# Patient Record
Sex: Male | Born: 1955 | Race: Black or African American | Hispanic: No | Marital: Single | Smoking: Never smoker
Health system: Southern US, Community
[De-identification: ages and names within clinical notes are randomized; demographics above are authoritative.]

---

## 2006-11-26 ENCOUNTER — Emergency Department (HOSPITAL_COMMUNITY): Admission: EM | Admit: 2006-11-26 | Discharge: 2006-11-26 | Payer: Self-pay | Admitting: Emergency Medicine

## 2007-11-29 ENCOUNTER — Inpatient Hospital Stay (HOSPITAL_COMMUNITY): Admission: EM | Admit: 2007-11-29 | Discharge: 2007-12-02 | Payer: Self-pay | Admitting: Family Medicine

## 2009-12-19 IMAGING — CR DG ABDOMEN ACUTE W/ 1V CHEST
4 series · 4 of 4 positions shown · non-contrast
Comparison: None

CLINICAL DATA: Dehydration.  Diarrhea.

ACUTE ABDOMEN SERIES (ABDOMEN 2 VIEW & CHEST 1 VIEW)

[w chest pa]
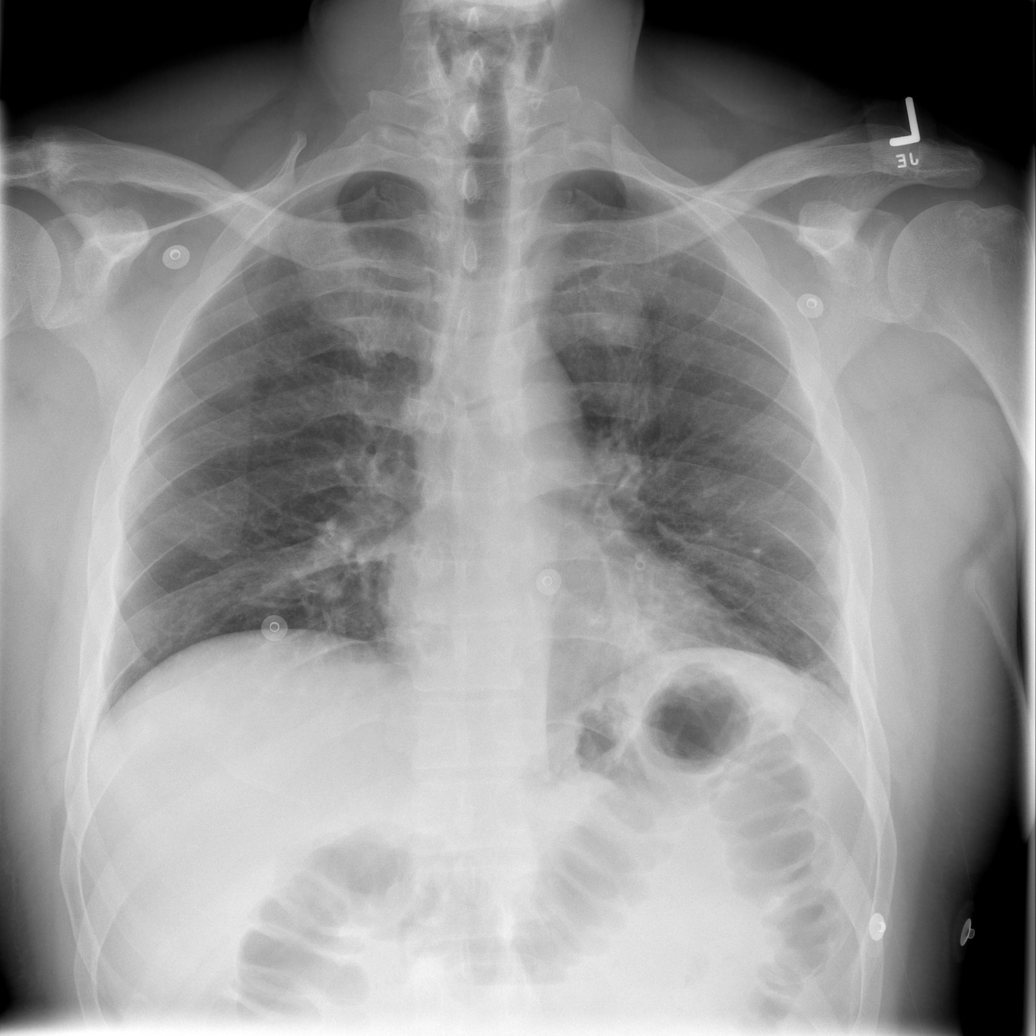

[w abdomen upright]
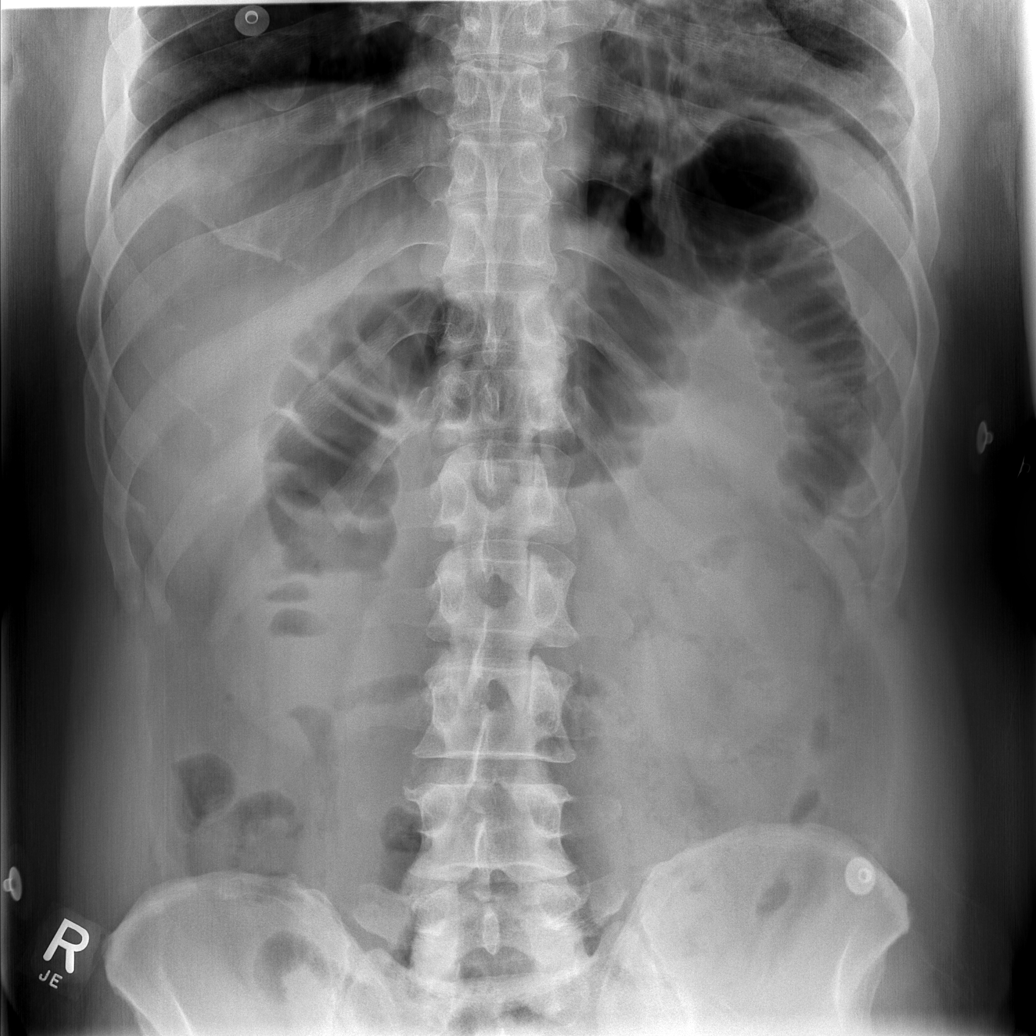

[t abdomen supine (1 of 2)]
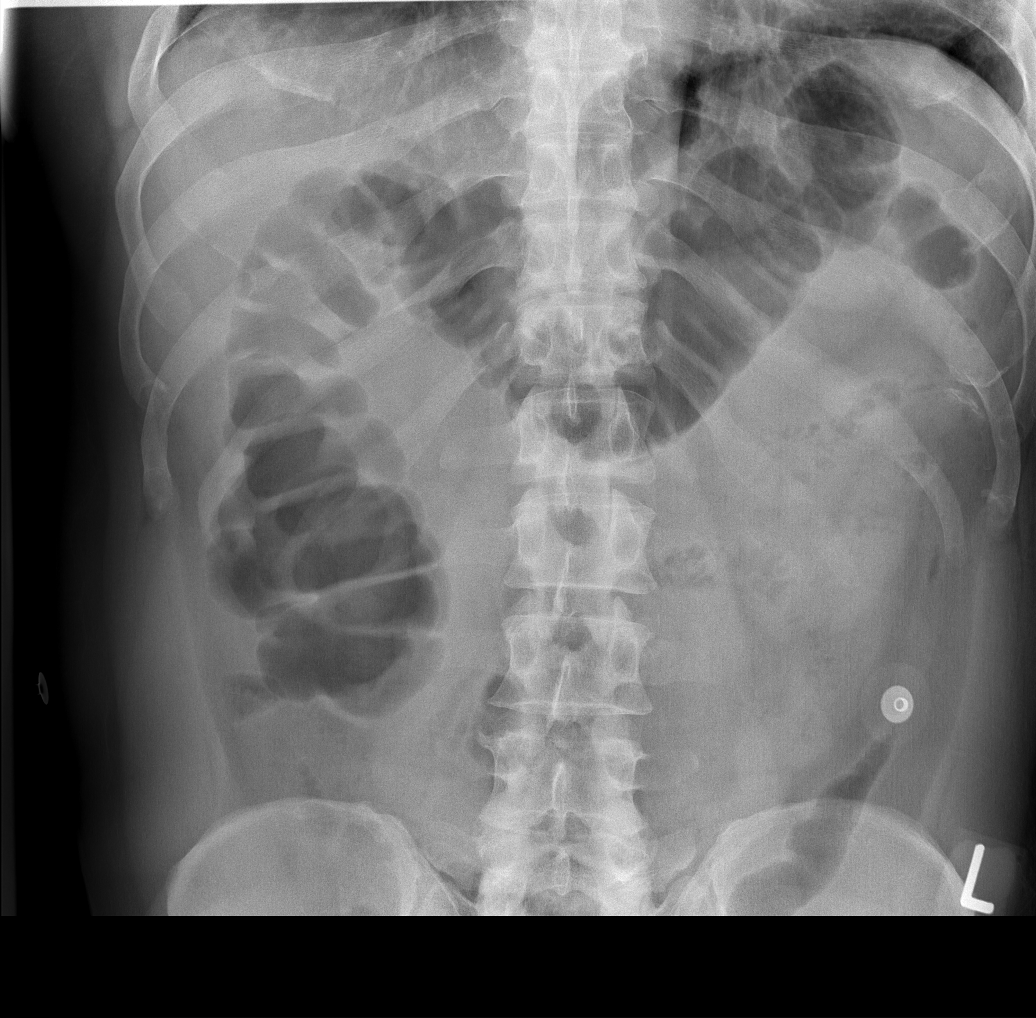

[t abdomen supine (2 of 2)]
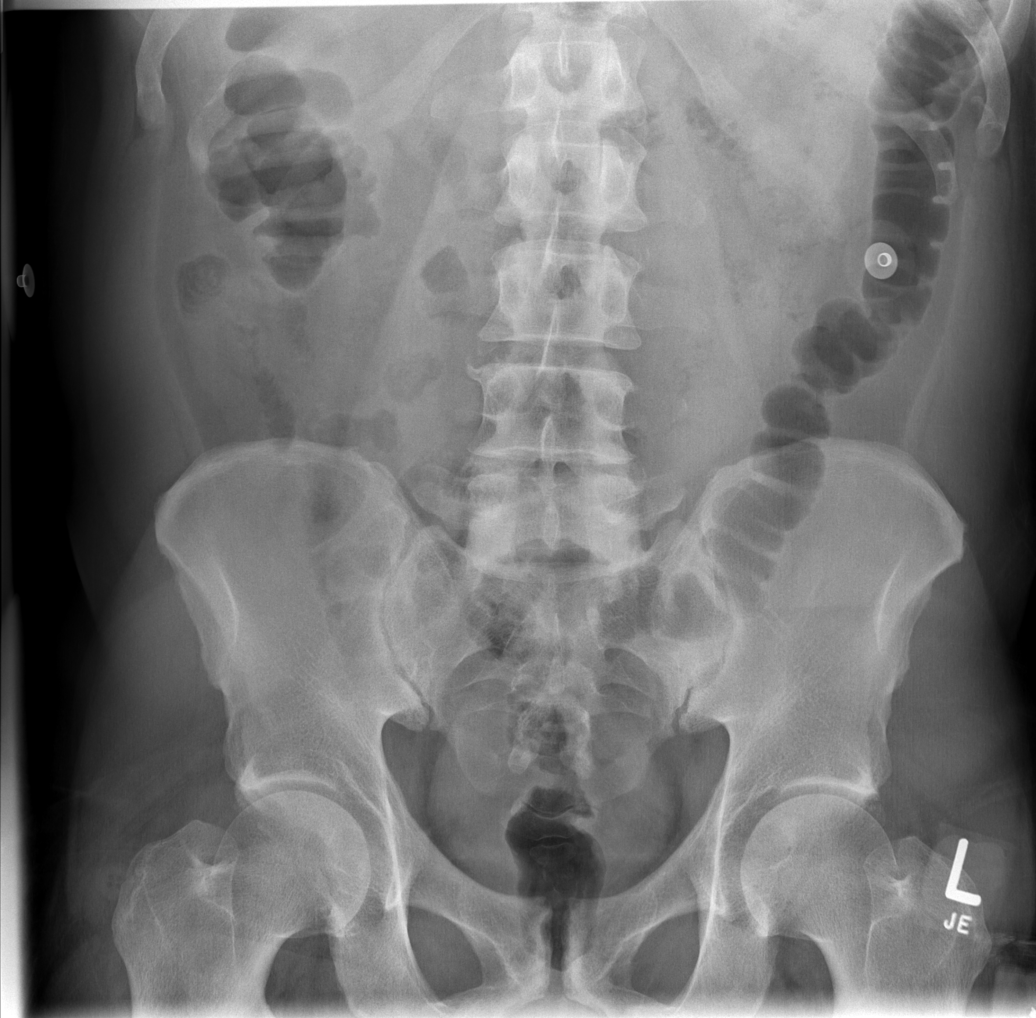

[4 of 4 positions shown; findings below may reference images not displayed]

FINDINGS: Heart size is normal.  There is density at the left lung
base, possibly representing atelectasis, or infiltrate.  A mass in
the left lower lobe is not excluded.  Follow-up is suggested.
There are perihilar bronchitic changes.  There is no free
intraperitoneal air beneath the diaphragm.

Supine and erect views of the abdomen show a nonobstructive bowel
gas pattern.  Gas is seen within nondilated loops of large and
small bowel.
IMPRESSION: 1.  Left lower lobe density consistent atelectasis, or infiltrate.
Mass cannot be entirely excluded.  A lateral view of the chest may
be helpful at this time.
2.  Nonobstructive bowel gas pattern.

## 2009-12-22 IMAGING — CR DG CHEST 2V
2 series · 2 of 2 positions shown · non-contrast
Comparison: Chest x-ray of 11/29/2007

CLINICAL DATA: Cough, weakness, short of breath

CHEST - 2 VIEW

[w chest pa]
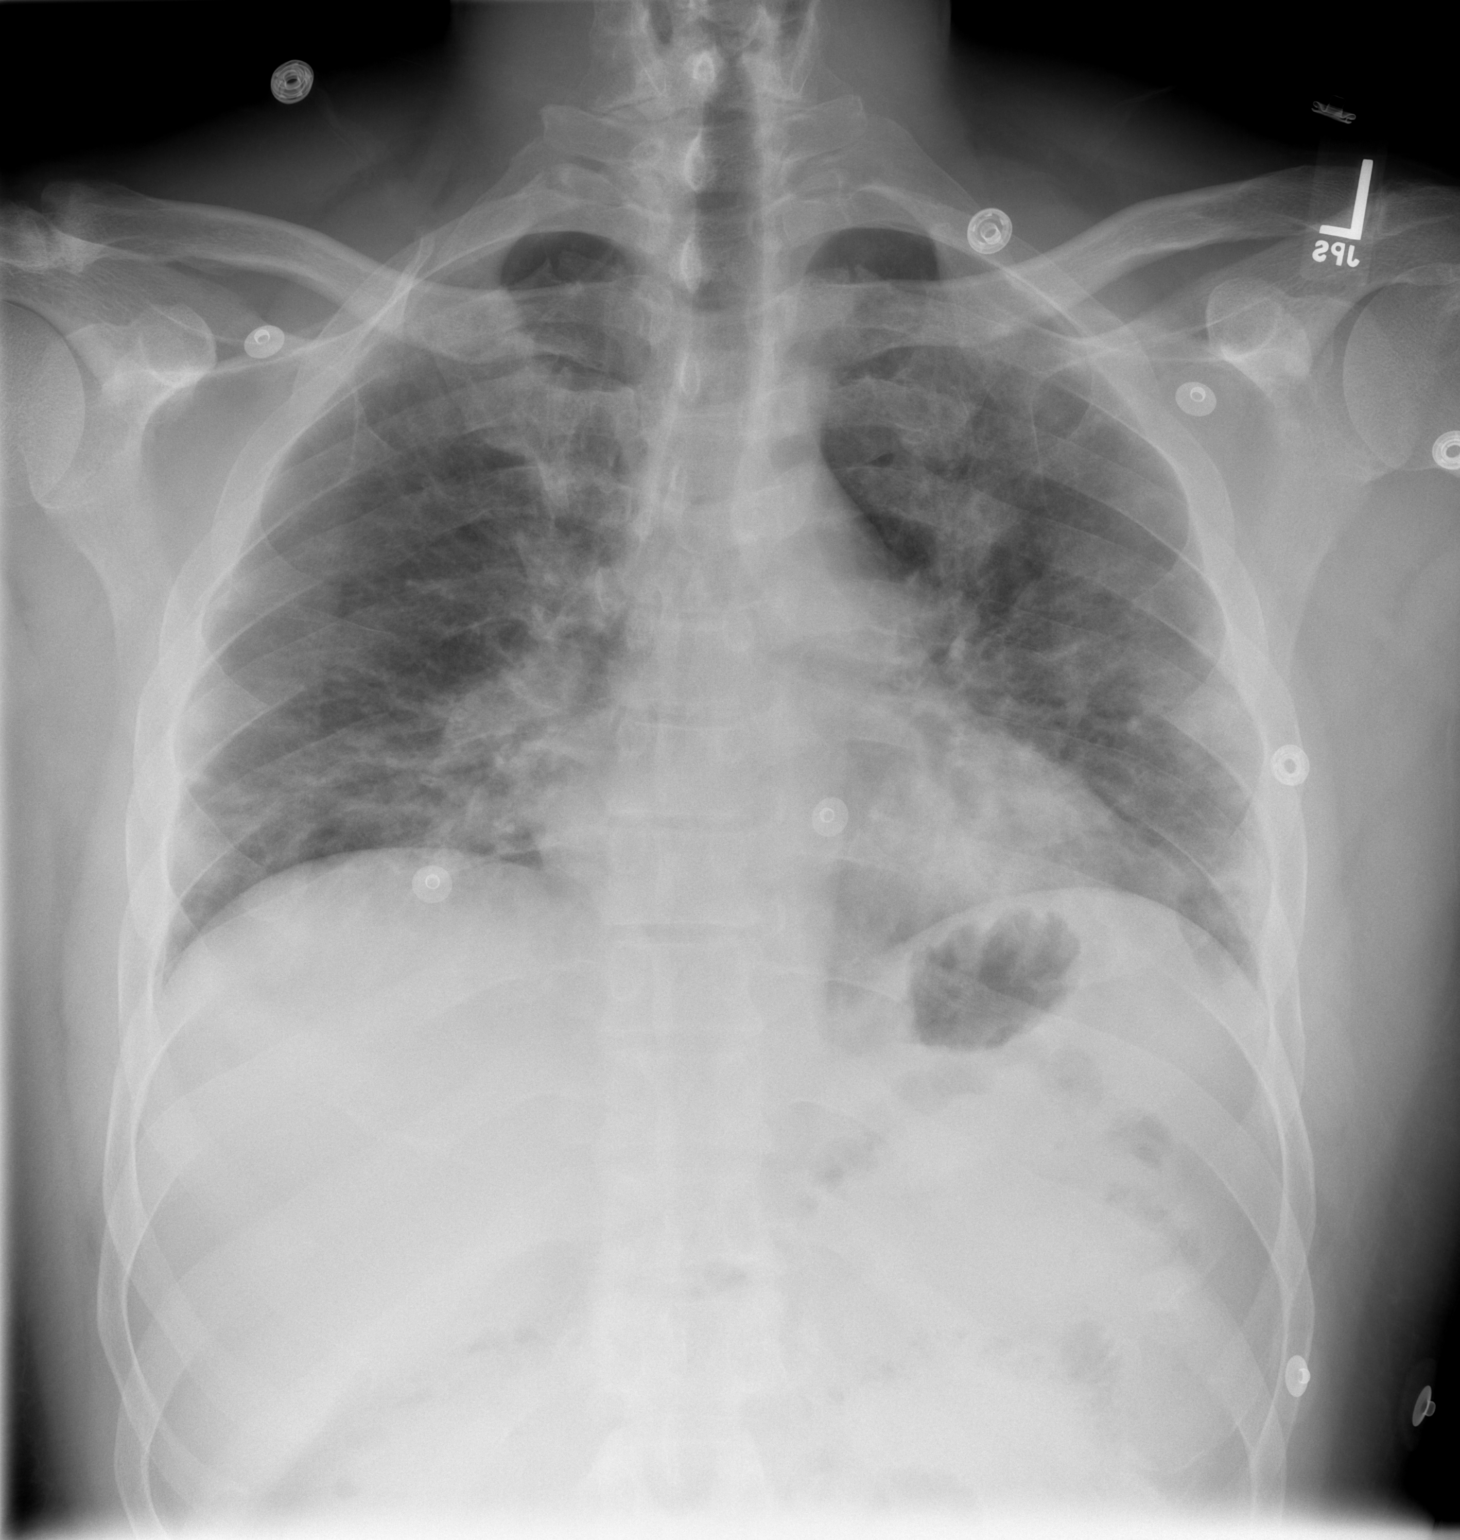

[w chest lat]
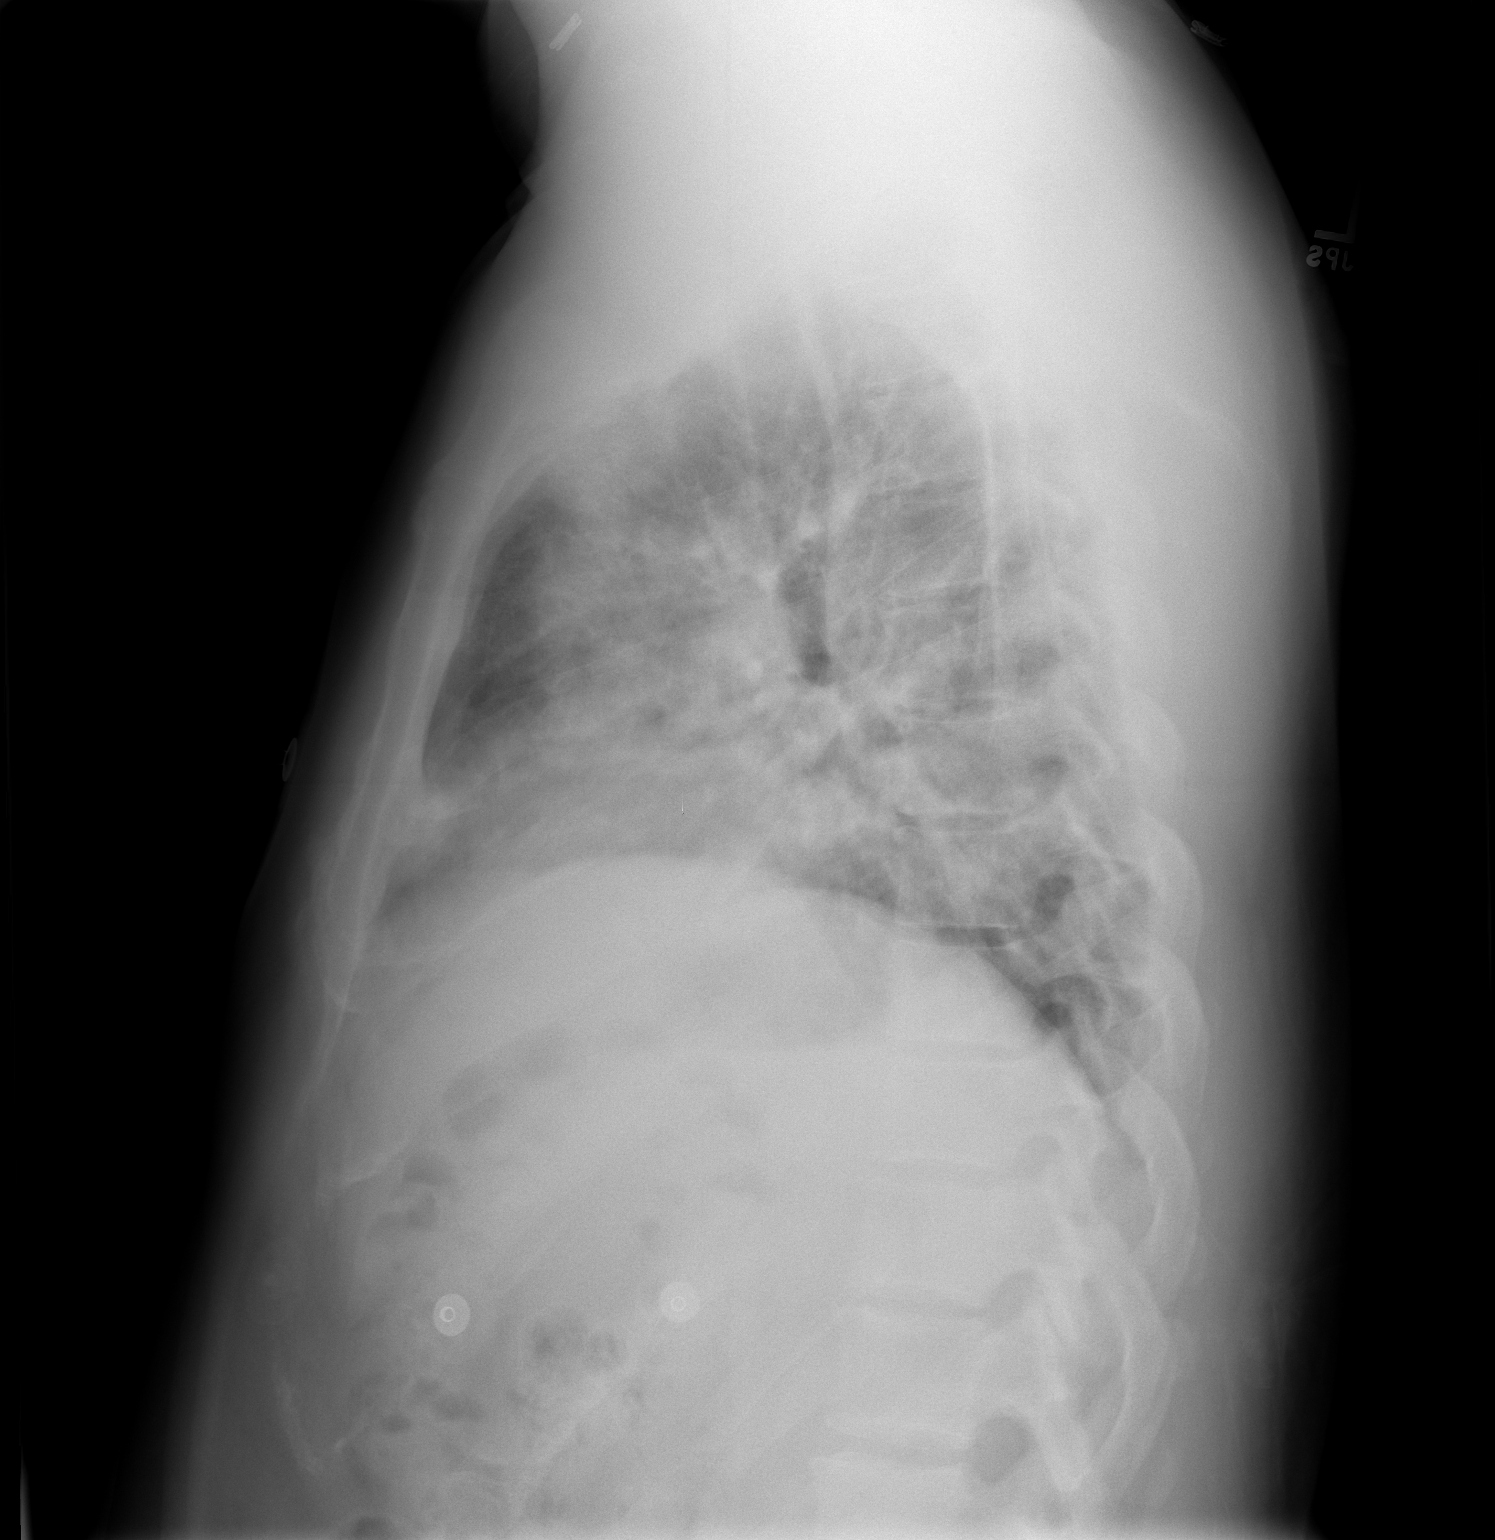

[2 of 2 positions shown; findings below may reference images not displayed]

FINDINGS: There are patchy opacities primarily in the lung bases
and in the left upper lung field suspicious for developing
pneumonia.  No effusion is seen.  The heart is stable in size.
IMPRESSION: Increasing patchy opacities primarily at the bases and in the left
upper lung field worrisome for developing pneumonia.

## 2009-12-27 ENCOUNTER — Emergency Department (HOSPITAL_COMMUNITY)
Admission: EM | Admit: 2009-12-27 | Discharge: 2009-12-27 | Payer: Self-pay | Source: Home / Self Care | Admitting: Family Medicine

## 2009-12-31 ENCOUNTER — Emergency Department (HOSPITAL_COMMUNITY)
Admission: EM | Admit: 2009-12-31 | Discharge: 2009-12-31 | Payer: Self-pay | Source: Home / Self Care | Admitting: Emergency Medicine

## 2010-06-04 NOTE — H&P (Signed)
NAMEPARTICK, MUSSELMAN NO.:  [id number]   MEDICAL RECORD NO.:  [id number]          PATIENT TYPE:  EMS   LOCATION:  URG                          FACILITY:  [facility]   PHYSICIAN:  Eduard Clos, MDDATE OF BIRTH:  [birth year]   DATE OF ADMISSION:  11/29/2007  DATE OF DISCHARGE:                              HISTORY & PHYSICAL   PRIMARY CARE PHYSICIAN:  Unassigned.   CHIEF COMPLAINT:  Diarrhea.   HISTORY OF PRESENT ILLNESS:  A 55 year old male with no significant past  medical history presented to the ER complaining of ongoing diarrhea for  the last 1-week.  The patient stated the diarrhea started a week ago,  which is continuous, not related to abdominal pain.  No fevers, chills  or any nausea or vomiting.  The patient denies going to any foreign  countries or having any food which is not usual and has had no contacts  with anybody having diarrhea.  The patient in the ER was found to have a  creatinine of 3.2 with a BUN of 103, stool guaiac being positive.  He is  being admitted for further management of his severe diarrhea,  dehydration and renal failure.   PAST MEDICAL HISTORY:  Nothing significant.   PAST SURGICAL HISTORY:  Left knee surgery for trauma as a child.   MEDICATIONS PRIOR TO ADMISSION:  None.   FAMILY HISTORY:  Nothing contributory.   SOCIAL HISTORY:  He denies smoking cigarettes, drinking alcohol or using  illegal drugs.  He lives alone.   REVIEW OF SYSTEMS:  As per history of present illness, nothing else  significant.  Specifically, the patient denies any chest pain, shortness  of breath, loss of consciousness, dizziness, dysuria, weakness of limbs,  headache, cough or productive sputum.   PHYSICAL EXAMINATION:  GENERAL:  Patient examined at bedside, not in  acute distress.  VITAL SIGNS:  Blood pressure 140/80, pulse 90 per minute, temperature  100.3, respirations 18 per minute.  O2 sats 98%.  HEENT:  Anicteric, no pallor.  CHEST:   Bilateral air entry present.  No rhonchi, no crepitation.  HEART:  S1-S2 heard.  ABDOMEN:  Soft, nontender.  Bowel sounds heard.  No guarding, no  rigidity.  CNS:  Alert, awake, oriented to time, place and person.  Moves upper and  lower extremities 5/5.  EXTREMITIES:  Peripheral pulses felt.  No edema.   LABORATORY DATA:  CBC:  WBC 4.3, hemoglobin 20.7, hematocrit 51,  platelets 203, neutrophils 57%.  Basic metabolic panel:  Sodium 126,  potassium 5, chloride 101, glucose 144, BUN 103, creatinine 3.2.  Stool  for occult blood is negative.   ASSESSMENT:  1. Severe diarrhea.  2. Severe dehydration secondary to diarrhea.  3. Acute renal failure from dehydration.  4. Hyponatremia from dehydration.   PLAN:  Will admit patient to medical floor for aggressive IV hydration.  Since the patient has a mild fever, will start on empiric antibiotics.  Sent stool for ova and parasite cultures, lactoferrin and WBC.  Place  patient on Questran.  Further recommendations and condition evolves.      Georgetta Haber  Demetra Shiner, MD  Electronically Signed     ANK/MEDQ  D:  11/29/2007  T:  11/29/2007  Job:  [id number]

## 2010-06-04 NOTE — Discharge Summary (Signed)
[name], [name] NO.:  [id number]   MEDICAL RECORD NO.:  [id number]          PATIENT TYPE:  INP   LOCATION:  5528                         FACILITY:  [facility]   PHYSICIAN:  Julian Gomez, M.D.    DATE OF BIRTH:  1955-04-26   DATE OF ADMISSION:  11/29/2007  DATE OF DISCHARGE:  12/02/2007                               DISCHARGE SUMMARY   DISCHARGE DIAGNOSES:  1. Diarrheal illness.  2. Acute renal failure secondary to hypovolemia.  3. Hyponatremia secondary to hypovolemia.  4. Microhematuria and proteinuria.  5. Bibasilar infiltrates, treating clinically as pneumonia.   DISCHARGE MEDICATIONS:  1. Avelox 400 mg daily until completed.  2. Flagyl 500 mg t.i.d. until completed.  3. Multivitamin once daily.   DISCHARGE DISPOSITION:  The patient is being discharged to home in  improved and stable condition.  A followup appointment is being arranged  with his new primary care physician, Dr. Della Gomez.   CONSULTATIONS:  Curbside consultation with nephrologist, Dr. Annie Gomez.   PROCEDURES PERFORMED:  1. Chest x-ray on December 02, 2007.  The results revealed increasing      patchy opacities primarily at the bases and in the left upper lung      field worrisome for developing pneumonia.  2. Renal ultrasound on November 30, 2007.  The results revealed no      evidence for hydronephrosis.  Heterogenous increased renal cortical      echogenicity bilaterally.  This may be secondary to medical renal      disease.  Pyelonephritis would also be a consideration with areas      of segmental edema.  3. Acute abdominal series on November 29, 2007.  The results revealed      left lower lobe density consistent with atelectasis or infiltrate.      Nonobstructive bowel gas pattern.   HISTORY OF PRESENT ILLNESS:  The patient is a 55 year old man with no  significant past medical history, who presented to the emergency  department on November 29, 2007 with a chief  complaint of ongoing  diarrhea for 1 week.  When the patient was evaluated in the emergency  department, he was noted to be hemodynamically stable and febrile with a  temperature of 100.3.  His lab data were significant for a hemoglobin of  20.7, a BUN of 103, and a creatinine of 3.2.  The patient was admitted  for further evaluation and management.   For additional details, please see the dictated history and physical.   HOSPITAL COURSE:  1. SEVERE DIARRHEA, ACUTE RENAL FAILURE, HYPONATREMIA, HYPOVOLEMIA.      The patient was started on IV fluid volume repletion with normal      saline at 150 mL an hour following a 1 L bolus.  Stool studies were      ordered, as well as blood cultures.  The patient was started on      empiric treatment with intravenous Flagyl and Cipro.  Questran was      added as well.  His renal function was followed closely.  An acute  abdominal series was ordered at the time of the initial hospital      assessment and it was unremarkable with the exception of a possible      infiltrate in the bases.  Over the course of the hospitalization,      the patient's stool studies revealed no evidence of C. diff, heme      positivity, and a pending culture.  Over the past 24 hours, he has      had no further loose bowel movements.  His urine output during the      hospitalization was within normal limits.  As of today, his BUN has      improved to 16 and his creatinine has improved to 1.29.  His serum      sodium which was 126 at the time of the initial hospital      assessment, normalized to 137 prior to hospital discharge.  Upon      discharge, the patient will be continued on empiric treatment with      Flagyl 500 mg t.i.d. for 10 more days.  The Cipro was discontinued      after 3-1/2 days of therapy when it was felt that the patient had      concomitant pneumonia.  Avelox was started instead.  2. MICROHEMATURIA AND PROTEINURIA.  The patient's initial urinalysis       revealed large blood, greater than 300 protein, uric acid crystals,      0-2 RBCs, and no leukocytes. Both the urine and blood cultures were      negative.  The patient had no complaints of painful urination      during the hospitalization.  However, given the results of the      urinalysis, a 24-hour urine to assess the proteinuria was ordered.      The 24-hour urine yielded 3067 mg of protein.  Nephrologist, Dr.      Kathrene Gomez was consulted (curb-side).  Per her assessment, in the      setting of acute renal injury from dehydration the amount of      proteinuria can be difficult to interpret.  She recommended      repeating the 24-hour urine in a few weeks for reassessment.  A      renal ultrasound was also ordered for further evaluation and it      revealed no evidence of hydronephrosis; however, per the      radiologist's interpretation, there was evidence of heterogeneous      increased renal cortical echogenicity bilaterally and a      consideration for pyelonephritis.  3. PNEUMONIA.  At the time of the initial hospital assessment, the      patient's chest x-ray revealed a left lower lobe density consistent      with atelectasis or infiltrate.  Because he was febrile, he was      started on empiric treatment with Flagyl and Cipro primarily for      presumed infectious diarrhea.  A followup chest x-ray following      volume repletion revealed bibasilar opacities.  The patient did      have a cough which was primarily nonproductive.  He attributed the      cough to a cold.  Following the results of the second chest x-      ray, Cipro was discontinued and he was subsequently started on      treatment with Avelox prior to hospital discharge.  The patient is  currently afebrile and his white blood cell count has normalized to      4.8.  Upon discharge, he will be continued on treatment with Avelox      for an additional week.      Julian Gomez, M.D.  Electronically  Signed     DF/MEDQ  D:  12/02/2007  T:  12/03/2007  Job:  [id number]   cc:   Julian Gomez, M.D.

## 2010-10-22 LAB — OSMOLALITY, URINE: Osmolality, Ur: 682

## 2010-10-22 LAB — URINE CULTURE
Colony Count: NO GROWTH
Culture: NO GROWTH

## 2010-10-22 LAB — COMPREHENSIVE METABOLIC PANEL
ALT: 31
AST: 65 — ABNORMAL HIGH
Albumin: 2.4 — ABNORMAL LOW
Albumin: 2.7 — ABNORMAL LOW
Alkaline Phosphatase: 50
BUN: 32 — ABNORMAL HIGH
BUN: 69 — ABNORMAL HIGH
Calcium: 7.5 — ABNORMAL LOW
Chloride: 107
Creatinine, Ser: 1.5
Creatinine, Ser: 2.06 — ABNORMAL HIGH
Total Bilirubin: 0.6
Total Bilirubin: 0.6

## 2010-10-22 LAB — LIPID PANEL
Cholesterol: 116
LDL Cholesterol: 54
Total CHOL/HDL Ratio: 6.8
Triglycerides: 224 — ABNORMAL HIGH

## 2010-10-22 LAB — URINALYSIS, ROUTINE W REFLEX MICROSCOPIC
Protein, ur: >300 — AB
Specific Gravity, Urine: 1.022
Specific Gravity, Urine: 1.023
Urobilinogen, UA: 0.2

## 2010-10-22 LAB — CBC
HCT: 56 — ABNORMAL HIGH
Hemoglobin: 16.4
MCHC: 32.4
MCHC: 32.5
MCV: 84
Platelets: 182
RBC: 5.11
RBC: 5.76
RDW: 13.8
RDW: 14.1
WBC: 4.3

## 2010-10-22 LAB — HEPATIC FUNCTION PANEL
ALT: 34
Albumin: 2.8 — ABNORMAL LOW
Alkaline Phosphatase: 57
Bilirubin, Direct: <0.1
Total Bilirubin: 0.4
Total Protein: 6.6

## 2010-10-22 LAB — CREATININE CLEARANCE, URINE, 24 HOUR
Collection Interval-CRCL: 24
Creatinine: 1.5
Urine Total Volume-CRCL: 2175

## 2010-10-22 LAB — DIFFERENTIAL
Basophils Absolute: 0
Basophils Relative: 0
Monocytes Relative: 11
Neutro Abs: 2.4
Neutrophils Relative %: 57

## 2010-10-22 LAB — GLUCOSE, CAPILLARY: Glucose-Capillary: 105 — ABNORMAL HIGH

## 2010-10-22 LAB — BASIC METABOLIC PANEL
BUN: 16
CO2: 20
Calcium: 8 — ABNORMAL LOW
Creatinine, Ser: 1.29
GFR calc Af Amer: >60

## 2010-10-22 LAB — URINE MICROSCOPIC-ADD ON

## 2010-10-22 LAB — CLOSTRIDIUM DIFFICILE EIA: C difficile Toxins A+B, EIA: NEGATIVE

## 2010-10-22 LAB — TSH: TSH: 1.395

## 2010-10-22 LAB — GIARDIA/CRYPTOSPORIDIUM SCREEN(EIA): Giardia Screen - EIA: NEGATIVE

## 2010-10-22 LAB — HEMOGLOBIN A1C: Hgb A1c MFr Bld: 6.4 — ABNORMAL HIGH

## 2010-10-22 LAB — POCT I-STAT, CHEM 8
Chloride: 101
Creatinine, Ser: 3.2 — ABNORMAL HIGH
Glucose, Bld: 144 — ABNORMAL HIGH
Sodium: 126 — ABNORMAL LOW
TCO2: 22

## 2010-10-22 LAB — PROTEIN, URINE, 24 HOUR
Collection Interval-UPROT: 24
Protein, 24H Urine: 3067 — ABNORMAL HIGH
Urine Total Volume-UPROT: 2175

## 2010-10-22 LAB — SODIUM, URINE, RANDOM: Sodium, Ur: 29

## 2010-10-22 LAB — STOOL CULTURE

## 2019-01-25 ENCOUNTER — Encounter (HOSPITAL_COMMUNITY): Payer: Self-pay

## 2019-01-25 ENCOUNTER — Other Ambulatory Visit: Payer: Self-pay

## 2019-01-25 ENCOUNTER — Ambulatory Visit (HOSPITAL_COMMUNITY)
Admission: EM | Admit: 2019-01-25 | Discharge: 2019-01-25 | Disposition: A | Discharge status: Home / Self Care | Payer: BC Managed Care – PPO | Attending: Family Medicine | Admitting: Family Medicine

## 2019-01-25 DIAGNOSIS — R300 Dysuria: Secondary | ICD-10-CM | POA: Insufficient documentation

## 2019-01-25 LAB — POCT URINALYSIS DIP (DEVICE)
Bilirubin Urine: NEGATIVE
Glucose, UA: NEGATIVE mg/dL
Ketones, ur: NEGATIVE mg/dL
Leukocytes,Ua: NEGATIVE
Nitrite: NEGATIVE
Protein, ur: NEGATIVE mg/dL
Specific Gravity, Urine: >=1.03 (ref 1.005–1.030)
Urobilinogen, UA: 0.2 mg/dL (ref 0.0–1.0)
pH: 5.5 (ref 5.0–8.0)

## 2019-01-25 MED ORDER — LIDOCAINE HCL (PF) 1 % IJ SOLN
INTRAMUSCULAR | Status: AC
  Filled 2019-01-25: qty 2

## 2019-01-25 MED ORDER — AZITHROMYCIN 250 MG PO TABS
1000.0000 mg | ORAL_TABLET | Freq: Once | ORAL | Status: AC
  Administered 2019-01-25: 1000 mg via ORAL

## 2019-01-25 MED ORDER — CEFTRIAXONE SODIUM 1 G IJ SOLR
INTRAMUSCULAR | Status: AC
  Filled 2019-01-25: qty 10

## 2019-01-25 MED ORDER — CEFTRIAXONE SODIUM 1 G IJ SOLR
0.5000 g | Freq: Once | INTRAMUSCULAR | Status: AC
  Administered 2019-01-25: 16:00:00 0.5 g via INTRAMUSCULAR

## 2019-01-25 MED ORDER — AZITHROMYCIN 250 MG PO TABS
ORAL_TABLET | ORAL | Status: AC
  Filled 2019-01-25: qty 4

## 2019-01-25 NOTE — ED Triage Notes (Signed)
Pt. States he has had painful urination since Thursday, it burns when he urinates.

## 2019-01-25 NOTE — Discharge Instructions (Addendum)
Your urine did not show any infection.  I will send for culture.  We will treat you prophylactically for possible STD exposure. We will send the swab for testing and call you with any positive results

## 2019-01-26 LAB — CYTOLOGY, (ORAL, ANAL, URETHRAL) ANCILLARY ONLY
Chlamydia: NEGATIVE
Neisseria Gonorrhea: NEGATIVE
Trichomonas: NEGATIVE

## 2019-01-26 LAB — URINE CULTURE: Culture: NO GROWTH

## 2019-01-26 NOTE — ED Provider Notes (Signed)
[facility]    CSN: [id number] Arrival date & time: 01/25/19  1317      History   Chief Complaint Chief Complaint  Patient presents with  . Urinary Tract Infection    HPI Julian Gomez is a 64 y.o. male.   Patient is a 64 year old male who presents today with dysuria x5 days.  Symptoms have been constant.  Denies any associated urinary frequency, hematuria, testicle pain or swelling.  No penile discharge.  No fever, flank pain, nausea or vomiting.  He is concerned of STDs.   ROS per HPI      History reviewed. No pertinent past medical history.  There are no problems to display for this patient.   History reviewed. No pertinent surgical history.     Home Medications    Prior to Admission medications   Not on File    Family History Family History  Problem Relation Age of Onset  . Healthy Mother   . Diabetes Father     Social History Social History   Tobacco Use  . Smoking status: Never Smoker  . Smokeless tobacco: Never Used  Substance Use Topics  . Alcohol use: Yes    Comment: socially  . Drug use: Never     Allergies   Patient has no known allergies.   Review of Systems Review of Systems   Physical Exam Triage Vital Signs ED Triage Vitals  Enc Vitals Group     BP 01/25/19 1439 (!) 173/96     Pulse Rate 01/25/19 1439 95     Resp 01/25/19 1439 18     Temp 01/25/19 1439 99.3 F (37.4 C)     Temp Source 01/25/19 1439 Oral     SpO2 01/25/19 1439 100 %     Weight 01/25/19 1436 264 lb 6.4 oz (119.9 kg)     Height --      Head Circumference --      Peak Flow --      Pain Score 01/25/19 1436 0     Pain Loc --      Pain Edu? --      Excl. in [place]? --    No data found.  Updated Vital Signs BP (!) 173/96 (BP Location: Right Arm)   Pulse 95   Temp 99.3 F (37.4 C) (Oral)   Resp 18   Wt 264 lb 6.4 oz (119.9 kg)   SpO2 100%   Visual Acuity Right Eye Distance:   Left Eye Distance:   Bilateral Distance:    Right Eye  Near:   Left Eye Near:    Bilateral Near:     Physical Exam Vitals and nursing note reviewed.  Constitutional:      Appearance: Normal appearance.  HENT:     Head: Normocephalic and atraumatic.     Nose: Nose normal.  Eyes:     Conjunctiva/sclera: Conjunctivae normal.  Pulmonary:     Effort: Pulmonary effort is normal.  Abdominal:     Palpations: Abdomen is soft.     Tenderness: There is no abdominal tenderness.  Musculoskeletal:        General: Normal range of motion.     Cervical back: Normal range of motion.  Skin:    General: Skin is warm and dry.  Neurological:     Mental Status: He is alert.  Psychiatric:        Mood and Affect: Mood normal.      UC Treatments / Results  Labs (  all labs ordered are listed, but only abnormal results are displayed) Labs Reviewed  POCT URINALYSIS DIP (DEVICE) - Abnormal; Notable for the following components:      Result Value   Hgb urine dipstick SMALL (*)    All other components within normal limits  URINE CULTURE  CYTOLOGY, (ORAL, ANAL, URETHRAL) ANCILLARY ONLY    EKG   Radiology No results found.  Procedures Procedures (including critical care time)  Medications Ordered in UC Medications  azithromycin (ZITHROMAX) tablet 1,000 mg (1,000 mg Oral Given 01/25/19 1531)  cefTRIAXone (ROCEPHIN) injection 0.5 g (0.5 g Intramuscular Given 01/25/19 1531)    Initial Impression / Assessment and Plan / UC Course  I have reviewed the triage vital signs and the nursing notes.  Pertinent labs & imaging results that were available during my care of the patient were reviewed by me and considered in my medical decision making (see chart for details).     Dysuria-urine without infection here today.  Showed small hemoglobin Will send for culture. Patient concerned for STDs.  Swab sent for testing Patient requesting to be treated for STDs. Treated prophylactically for gonorrhea and chlamydia here today in clinic Labs pending Final  Clinical Impressions(s) / UC Diagnoses   Final diagnoses:  Dysuria     Discharge Instructions     Your urine did not show any infection.  I will send for culture.  We will treat you prophylactically for possible STD exposure. We will send the swab for testing and call you with any positive results    ED Prescriptions    None     PDMP not reviewed this encounter.   Loura Halt A, NP 01/26/19 1457

## 2019-06-27 ENCOUNTER — Ambulatory Visit (INDEPENDENT_AMBULATORY_CARE_PROVIDER_SITE_OTHER): Payer: BC Managed Care – PPO

## 2019-06-27 ENCOUNTER — Encounter (HOSPITAL_COMMUNITY): Payer: Self-pay

## 2019-06-27 ENCOUNTER — Other Ambulatory Visit: Payer: Self-pay

## 2019-06-27 ENCOUNTER — Ambulatory Visit (HOSPITAL_COMMUNITY)
Admission: EM | Admit: 2019-06-27 | Discharge: 2019-06-27 | Disposition: A | Discharge status: Home / Self Care | Payer: BC Managed Care – PPO | Attending: Family Medicine | Admitting: Family Medicine

## 2019-06-27 DIAGNOSIS — S46912A Strain of unspecified muscle, fascia and tendon at shoulder and upper arm level, left arm, initial encounter: Secondary | ICD-10-CM | POA: Diagnosis not present

## 2019-06-27 DIAGNOSIS — M25512 Pain in left shoulder: Secondary | ICD-10-CM

## 2019-06-27 MED ORDER — PREDNISONE 10 MG (21) PO TBPK: ORAL_TABLET | ORAL | 0 refills | Status: AC

## 2019-06-27 NOTE — ED Provider Notes (Signed)
[place]    CSN: [id number] Arrival date & time: 06/27/19  0803      History   Chief Complaint Chief Complaint  Patient presents with  . Shoulder Pain    HPI Julian Gomez is a 64 y.o. male.   Patient is a 64 year old male presents today with left shoulder pain. This started approximate 4 days ago after doing some heavy lifting. Symptoms have been constant. He has been taking Aleve with some relief of the pain. Limited range of motion. Denies any radiation of pain, numbness, tingling, weakness in arm.  ROS per HPI      History reviewed. No pertinent past medical history.  There are no problems to display for this patient.   History reviewed. No pertinent surgical history.     Home Medications    Prior to Admission medications   Medication Sig Start Date End Date Taking? Authorizing Provider  predniSONE (STERAPRED UNI-PAK 21 TAB) 10 MG (21) TBPK tablet 6 tabs for 1 day, then 5 tabs for 1 das, then 4 tabs for 1 day, then 3 tabs for 1 day, 2 tabs for 1 day, then 1 tab for 1 day 06/27/19   Orvan July, NP    Family History Family History  Problem Relation Age of Onset  . Healthy Mother   . Diabetes Father     Social History Social History   Tobacco Use  . Smoking status: Never Smoker  . Smokeless tobacco: Never Used  Substance Use Topics  . Alcohol use: Yes    Comment: socially  . Drug use: Never     Allergies   Patient has no known allergies.   Review of Systems Review of Systems   Physical Exam Triage Vital Signs ED Triage Vitals [06/27/19 0824]  Enc Vitals Group     BP (!) 178/109     Pulse Rate (!) 102     Resp 18     Temp 98.2 F (36.8 C)     Temp Source Oral     SpO2 94 %     Weight      Height      Head Circumference      Peak Flow      Pain Score 6     Pain Loc      Pain Edu?      Excl. in [place]?    No data found.  Updated Vital Signs BP (!) 178/109 (BP Location: Right Arm)   Pulse (!) 102   Temp 98.2  F (36.8 C) (Oral)   Resp 18   SpO2 94%   Visual Acuity Right Eye Distance:   Left Eye Distance:   Bilateral Distance:    Right Eye Near:   Left Eye Near:    Bilateral Near:     Physical Exam Vitals and nursing note reviewed.  Constitutional:      Appearance: Normal appearance.  HENT:     Head: Normocephalic and atraumatic.     Nose: Nose normal.  Eyes:     Conjunctiva/sclera: Conjunctivae normal.  Pulmonary:     Effort: Pulmonary effort is normal.  Musculoskeletal:     Left shoulder: Tenderness present. No swelling, deformity or crepitus. Decreased range of motion. Normal strength.       Arms:     Cervical back: Normal range of motion.     Comments: Very limited ROM with tenderness at the [facility] joint.   Skin:    General: Skin is warm  and dry.  Neurological:     Mental Status: He is alert.  Psychiatric:        Mood and Affect: Mood normal.      UC Treatments / Results  Labs (all labs ordered are listed, but only abnormal results are displayed) Labs Reviewed - No data to display  EKG   Radiology DG Shoulder Left  Result Date: 06/27/2019 CLINICAL DATA:  Left shoulder pain EXAM: LEFT SHOULDER - 2+ VIEW COMPARISON:  None. FINDINGS: Degenerative changes in the left [facility] joint. No acute bony abnormality. Specifically, no fracture, subluxation, or dislocation. IMPRESSION: No acute bony abnormality. Electronically Signed   By: Charlett Nose M.D.   On: 06/27/2019 09:10    Procedures Procedures (including critical care time)  Medications Ordered in UC Medications - No data to display  Initial Impression / Assessment and Plan / UC Course  I have reviewed the triage vital signs and the nursing notes.  Pertinent labs & imaging results that were available during my care of the patient were reviewed by me and considered in my medical decision making (see chart for details).     Shoulder strain He most likely strained his shoulder with a heavy lifting. He does have some  degenerative changes in the [facility] joint this could be causing his pain. Will treat with prednisone taper Rest, ice the shoulder Recommend follow-up with sports medicine for further problems Final Clinical Impressions(s) / UC Diagnoses   Final diagnoses:  Strain of left shoulder, initial encounter     Discharge Instructions     Your x ray did not show any broken bones Some degenerative changes in the joint  We will treat the pain with prednisone taper.  I would like for you to follow up with sports medicine Rest, ice try to do some gentle movement of the arm to avoid frozen shoulder.  Follow up as needed for continued or worsening symptoms     ED Prescriptions    Medication Sig Dispense Auth. Provider   predniSONE (STERAPRED UNI-PAK 21 TAB) 10 MG (21) TBPK tablet 6 tabs for 1 day, then 5 tabs for 1 das, then 4 tabs for 1 day, then 3 tabs for 1 day, 2 tabs for 1 day, then 1 tab for 1 day 21 tablet Akim Watkinson A, NP     PDMP not reviewed this encounter.   Dahlia Byes A, NP 06/27/19 1019

## 2019-06-27 NOTE — ED Triage Notes (Signed)
Pt presents with left shoulder pain X 4 days that he believes from exertion.

## 2019-06-27 NOTE — Discharge Instructions (Signed)
Your x ray did not show any broken bones Some degenerative changes in the joint  We will treat the pain with prednisone taper.  I would like for you to follow up with sports medicine Rest, ice try to do some gentle movement of the arm to avoid frozen shoulder.  Follow up as needed for continued or worsening symptoms
